# Patient Record
Sex: Female | Born: 1965
Health system: Southern US, Community
[De-identification: ages and names within clinical notes are randomized; demographics above are authoritative.]

## PROBLEM LIST (undated history)

## (undated) DIAGNOSIS — I10 Essential (primary) hypertension: Secondary | ICD-10-CM

---

## 2017-09-07 ENCOUNTER — Emergency Department (HOSPITAL_COMMUNITY): Payer: Self-pay

## 2017-09-07 ENCOUNTER — Encounter (HOSPITAL_COMMUNITY): Payer: Self-pay | Admitting: Emergency Medicine

## 2017-09-07 ENCOUNTER — Other Ambulatory Visit: Payer: Self-pay

## 2017-09-07 ENCOUNTER — Emergency Department (HOSPITAL_COMMUNITY)
Admission: EM | Admit: 2017-09-07 | Discharge: 2017-09-08 | Disposition: A | Discharge status: Home / Self Care | Payer: Self-pay | Attending: Emergency Medicine | Admitting: Emergency Medicine

## 2017-09-07 DIAGNOSIS — R079 Chest pain, unspecified: Secondary | ICD-10-CM | POA: Insufficient documentation

## 2017-09-07 DIAGNOSIS — Z5321 Procedure and treatment not carried out due to patient leaving prior to being seen by health care provider: Secondary | ICD-10-CM | POA: Insufficient documentation

## 2017-09-07 HISTORY — DX: Essential (primary) hypertension: I10

## 2017-09-07 LAB — BASIC METABOLIC PANEL
Anion gap: 14 (ref 5–15)
BUN: 16 mg/dL (ref 6–20)
CHLORIDE: 106 mmol/L (ref 101–111)
CO2: 21 mmol/L — ABNORMAL LOW (ref 22–32)
Calcium: 9.6 mg/dL (ref 8.9–10.3)
Creatinine, Ser: 0.75 mg/dL (ref 0.44–1.00)
GFR calc non Af Amer: >60 mL/min (ref >60)
Glucose, Bld: 171 mg/dL — ABNORMAL HIGH (ref 65–99)
Potassium: 4.1 mmol/L (ref 3.5–5.1)
SODIUM: 141 mmol/L (ref 135–145)

## 2017-09-07 LAB — CBC
HCT: 38.2 % (ref 36.0–46.0)
Hemoglobin: 13.1 g/dL (ref 12.0–15.0)
MCH: 33.8 pg (ref 26.0–34.0)
MCHC: 34.3 g/dL (ref 30.0–36.0)
MCV: 98.5 fL (ref 78.0–100.0)
PLATELETS: 279 10*3/uL (ref 150–400)
RBC: 3.88 MIL/uL (ref 3.87–5.11)
RDW: 13.2 % (ref 11.5–15.5)
WBC: 7.3 10*3/uL (ref 4.0–10.5)

## 2017-09-07 LAB — TROPONIN I

## 2017-09-07 NOTE — ED Triage Notes (Signed)
Pt complaint of mid chest pain with tingling down left arm onset 1.5 hours ago.

## 2017-09-07 NOTE — ED Notes (Signed)
Pt states she is leaving  

## 2017-09-07 NOTE — ED Notes (Signed)
Error happened with I-STAT Troponin. Order was changed by RN to regular Troponin. Called lab they are going to run the lab.

## 2018-08-17 IMAGING — CR DG CHEST 2V
2 series · 2 of 2 positions shown · non-contrast
Comparison: None.

CLINICAL DATA: Chest pain.

EXAM:
CHEST  2 VIEW

[w chest pa]
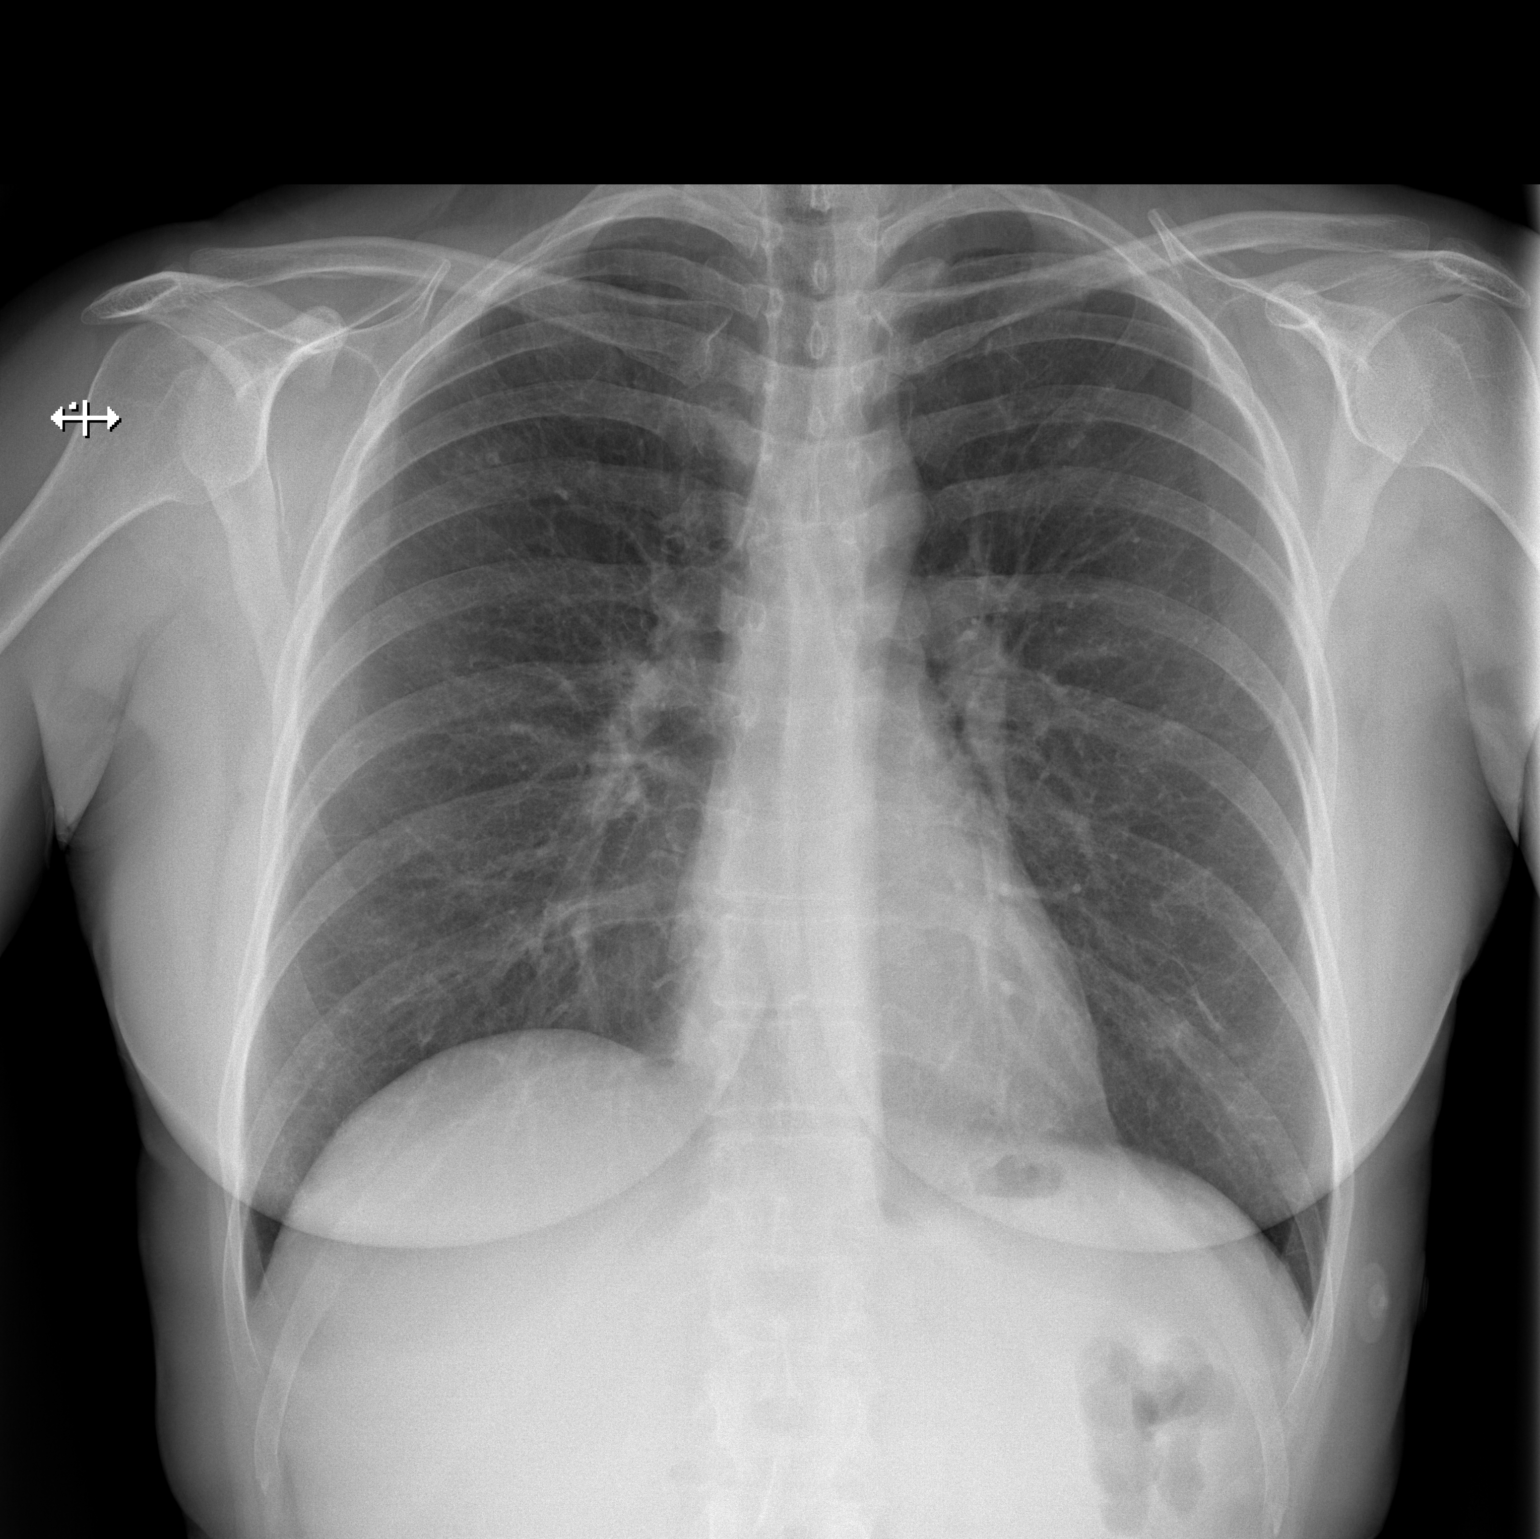

[w chest lat]
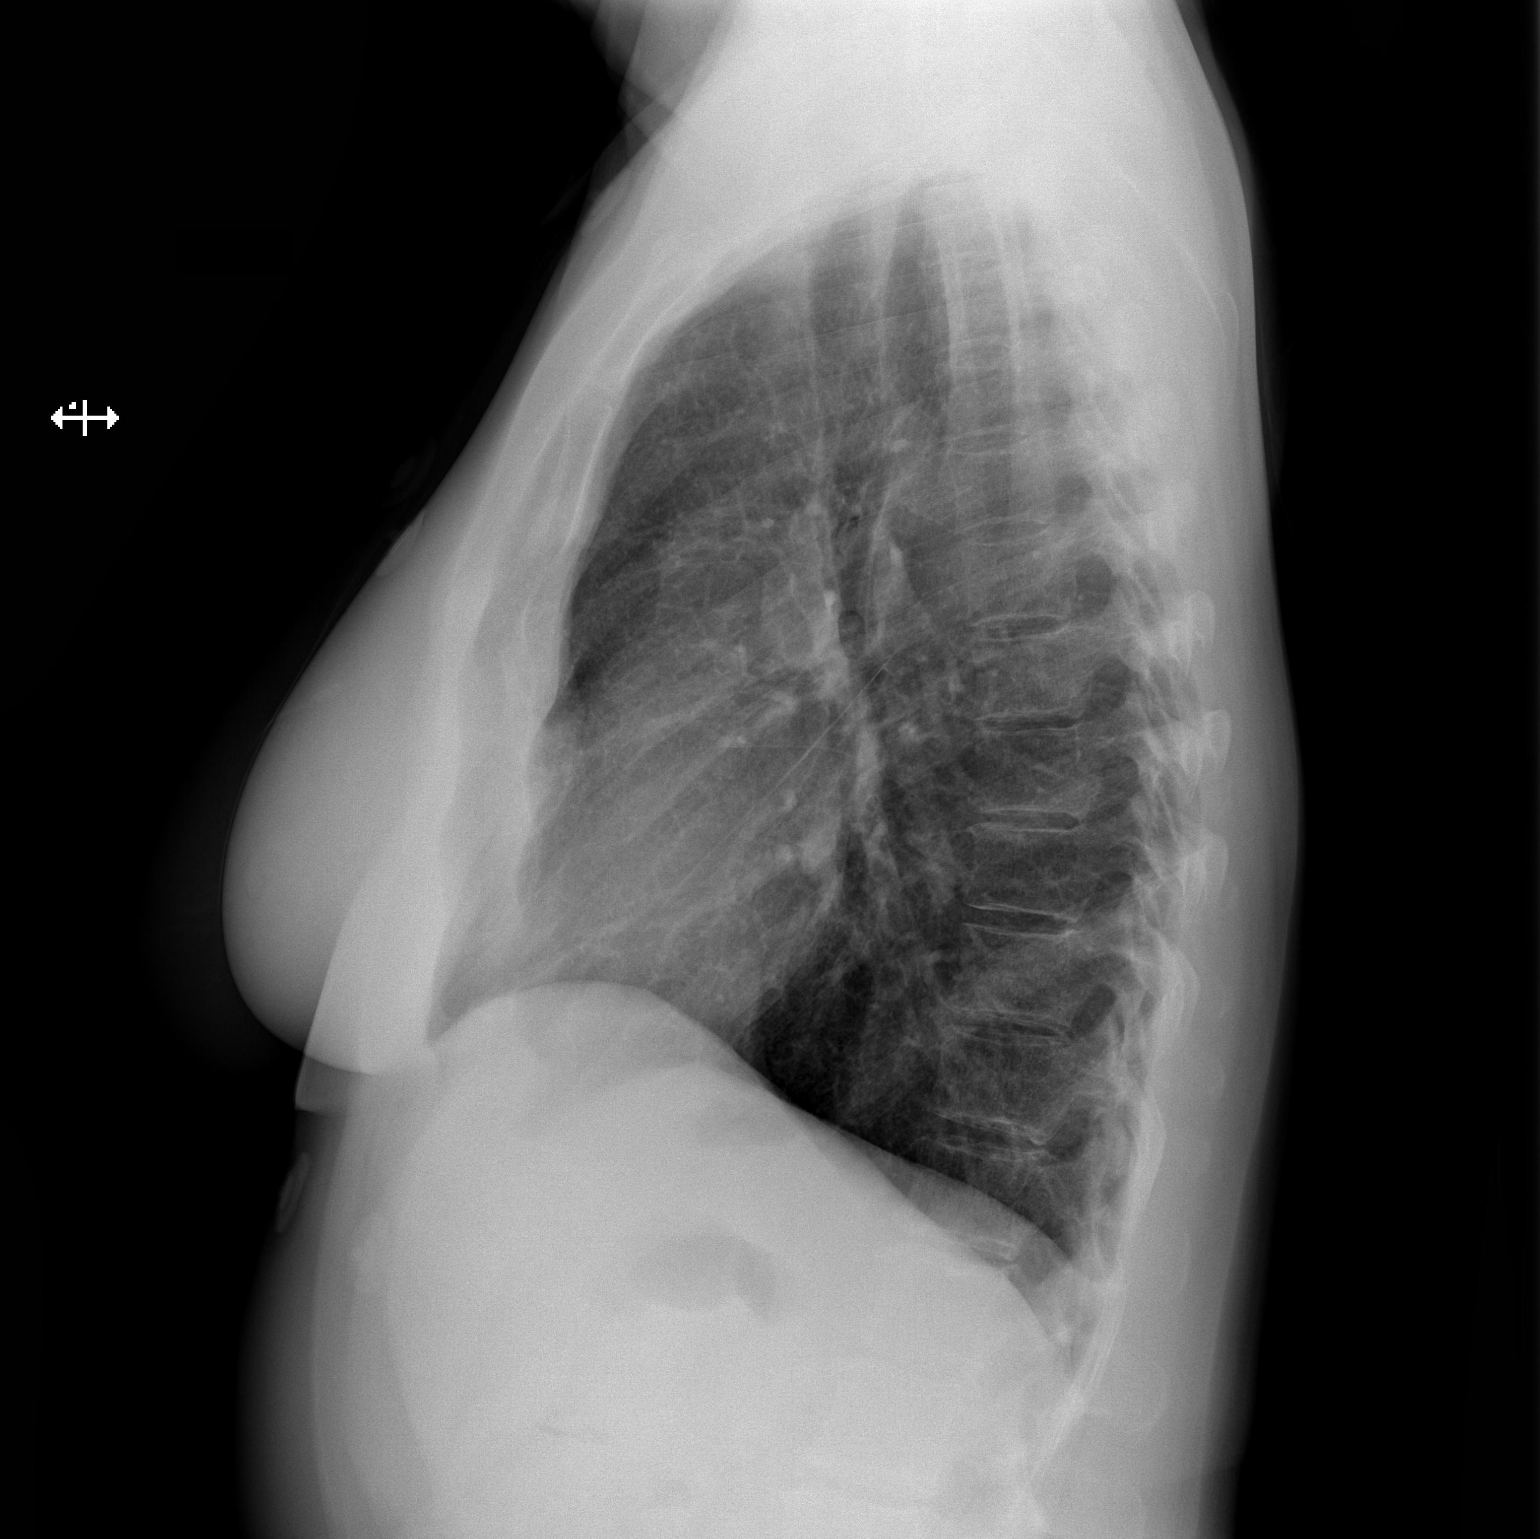

[2 of 2 positions shown; findings below may reference images not displayed]

FINDINGS: The heart size and mediastinal contours are within normal limits.
Both lungs are clear. The visualized skeletal structures are
unremarkable.
IMPRESSION: Negative two view chest x-ray
